# Patient Record
Sex: Female | Born: 1985 | Race: White | Hispanic: No | Marital: Single | Smoking: Current every day smoker
Health system: Southern US, Community
[De-identification: ages and names within clinical notes are randomized; demographics above are authoritative.]

## PROBLEM LIST (undated history)

## (undated) DIAGNOSIS — F32A Depression, unspecified: Secondary | ICD-10-CM

## (undated) DIAGNOSIS — F329 Major depressive disorder, single episode, unspecified: Secondary | ICD-10-CM

---

## 2007-12-07 ENCOUNTER — Emergency Department: Payer: Self-pay | Admitting: Emergency Medicine

## 2007-12-12 ENCOUNTER — Emergency Department: Payer: Self-pay | Admitting: Internal Medicine

## 2008-09-23 ENCOUNTER — Emergency Department: Payer: Self-pay | Admitting: Emergency Medicine

## 2009-07-04 ENCOUNTER — Encounter: Payer: Self-pay | Admitting: Maternal & Fetal Medicine

## 2009-07-18 ENCOUNTER — Encounter: Payer: Self-pay | Admitting: Maternal & Fetal Medicine

## 2009-07-31 ENCOUNTER — Encounter: Payer: Self-pay | Admitting: Maternal & Fetal Medicine

## 2009-08-25 ENCOUNTER — Observation Stay: Payer: Self-pay

## 2009-10-17 ENCOUNTER — Encounter: Payer: Self-pay | Admitting: Obstetrics & Gynecology

## 2010-05-26 ENCOUNTER — Ambulatory Visit: Payer: Self-pay | Admitting: Internal Medicine

## 2010-07-15 IMAGING — CR DG CHEST 2V
1 series · 2 of 2 positions shown · non-contrast
Comparison: none

REASON FOR EXAM: Cough
COMMENTS:

[Series 1: view not recorded · 0.17mm/px · 2 of 2 slices shown]
[im 1/2]
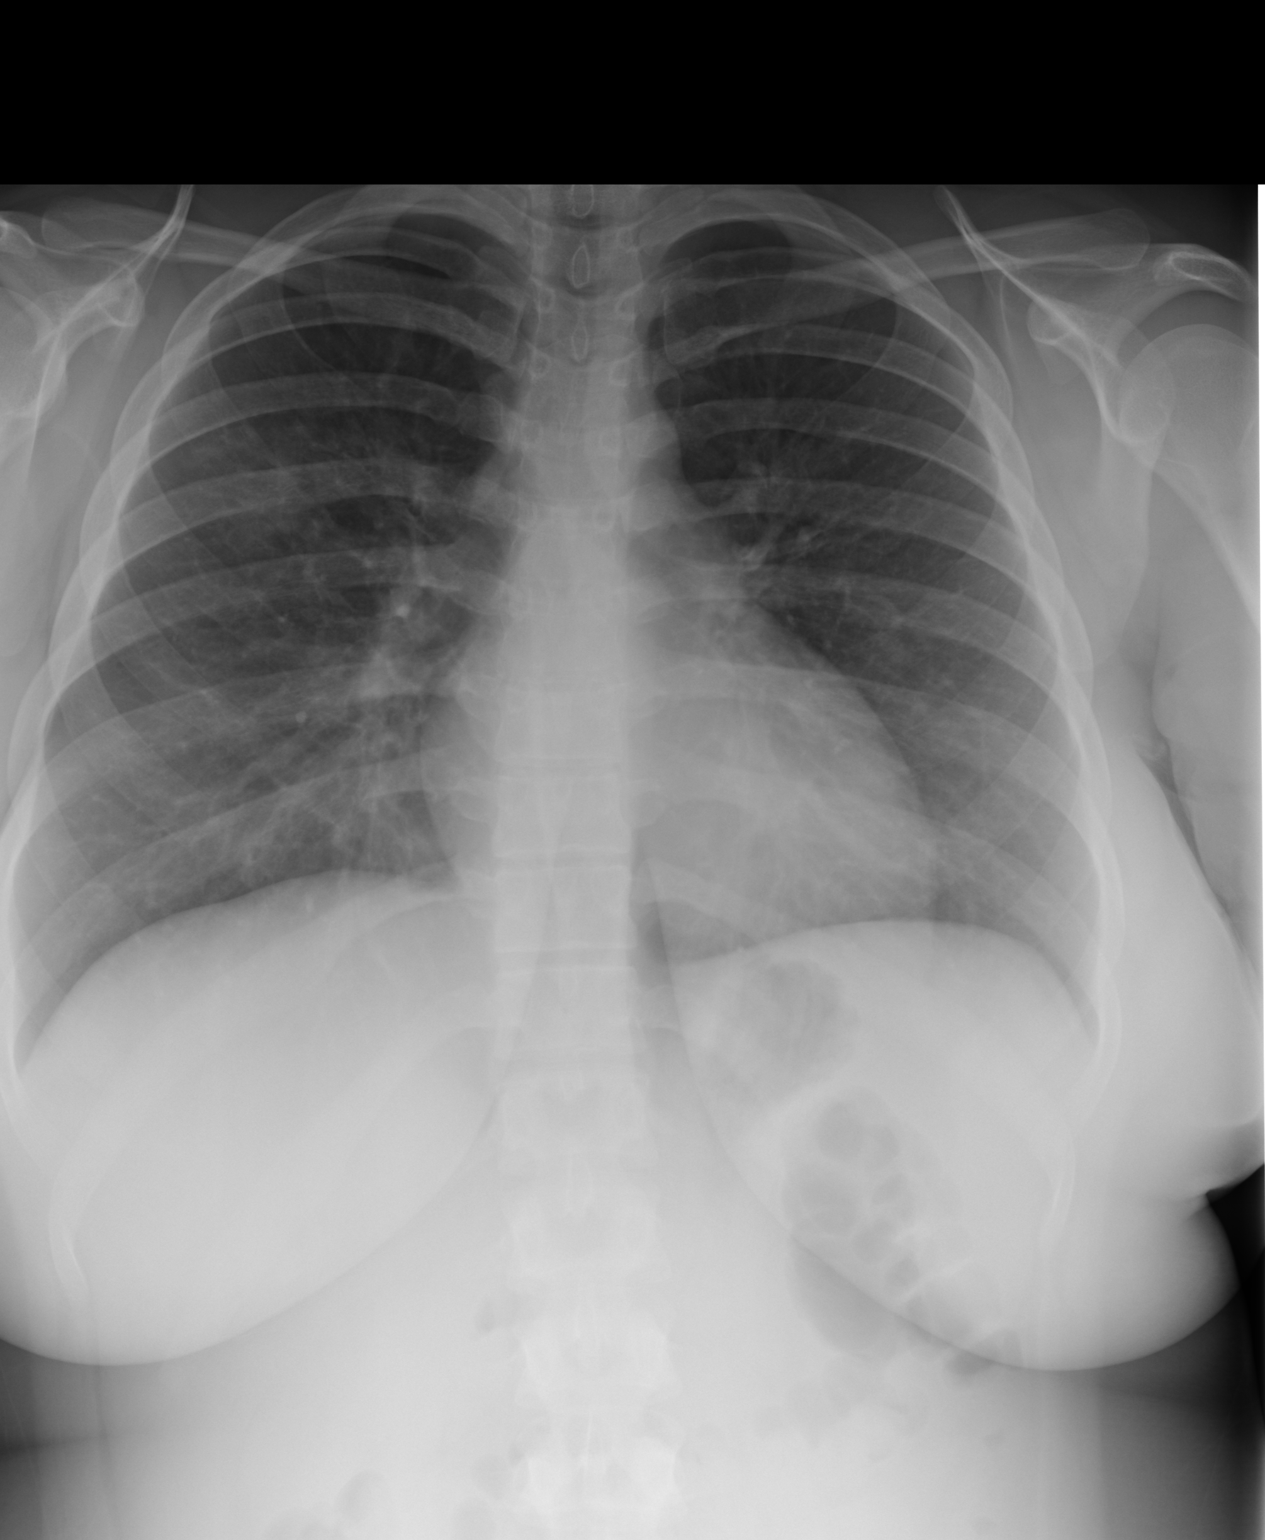
[im 2/2]
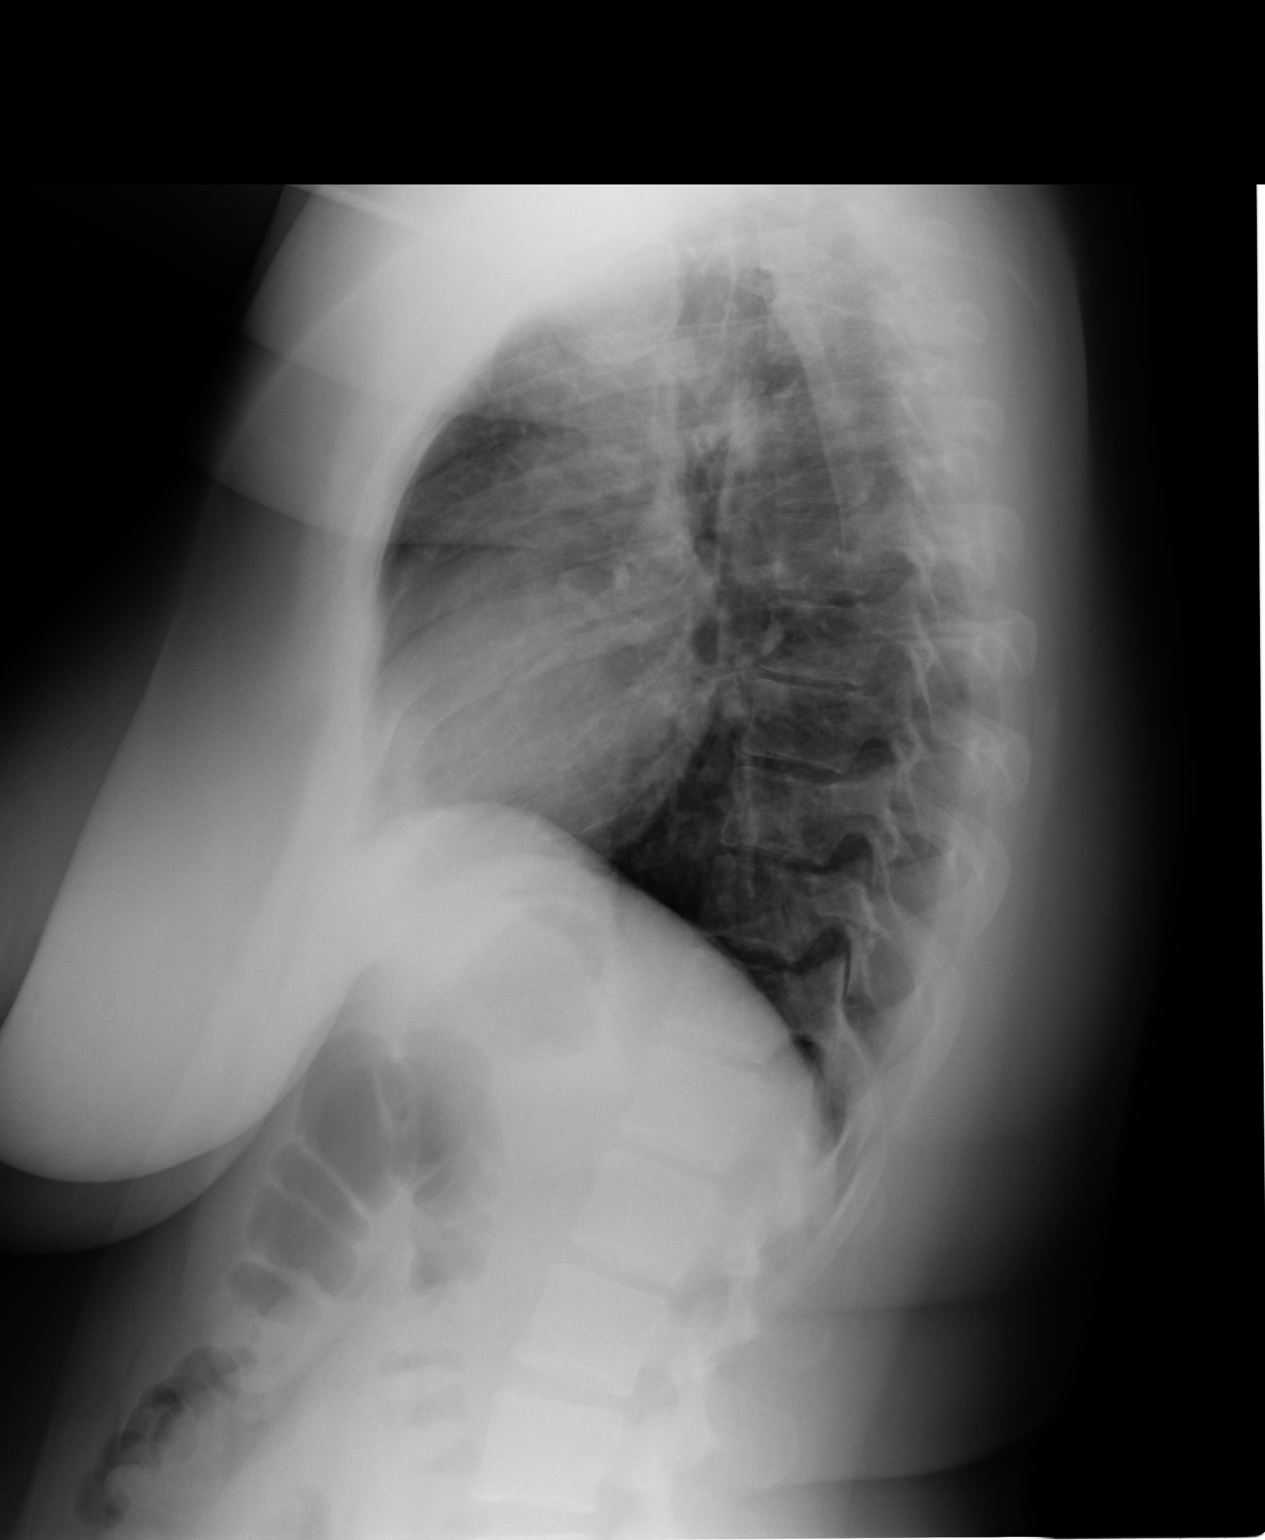

[2 of 2 positions shown; findings below may reference images not displayed]

PROCEDURE:     DXR - DXR CHEST PA (OR AP) AND LATERAL  - December 07, 2007  [DATE]

RESULT:     Motion artifact is present. The lungs appear to be clear except
for minimal density in the RIGHT mid lung to RIGHT upper lobe region. This
could represent early infiltrate.  Follow-up is recommended. There is no
effusion or pneumothorax.
IMPRESSION: Ill-defined patchy density in the RIGHT upper lobe
suggestive of early pneumonia. Follow-up is recommended.

## 2011-05-02 IMAGING — CR DG CHEST 2V
1 series · 2 of 2 positions shown · non-contrast
Comparison: none

REASON FOR EXAM: rhonchi breath sound
COMMENTS:

PROCEDURE:     DXR - DXR CHEST PA (OR AP) AND LATERAL  - September 23, 2008 [DATE]
RESULT:     The lungs are clear. The cardiovascular structures are
unremarkable.

[Series 1: view not recorded · 0.17mm/px · 2 of 2 slices shown]
[im 1/2]
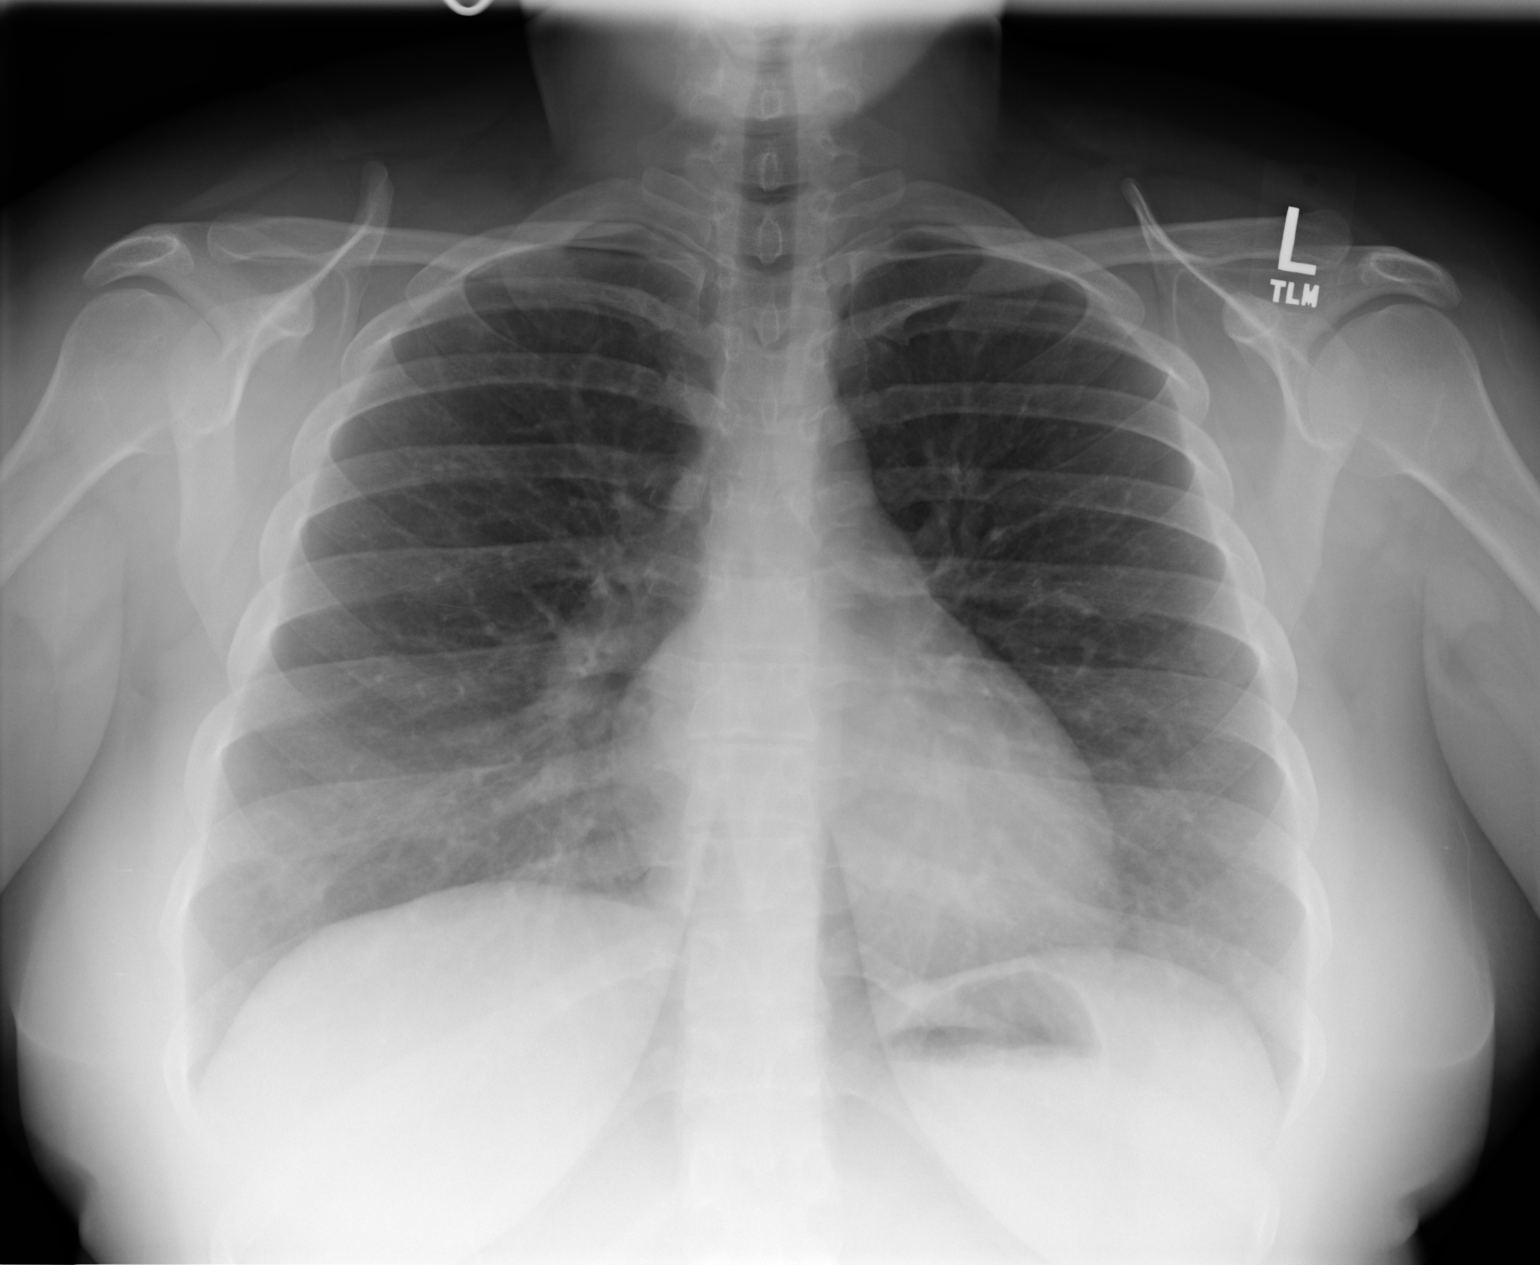
[im 2/2]
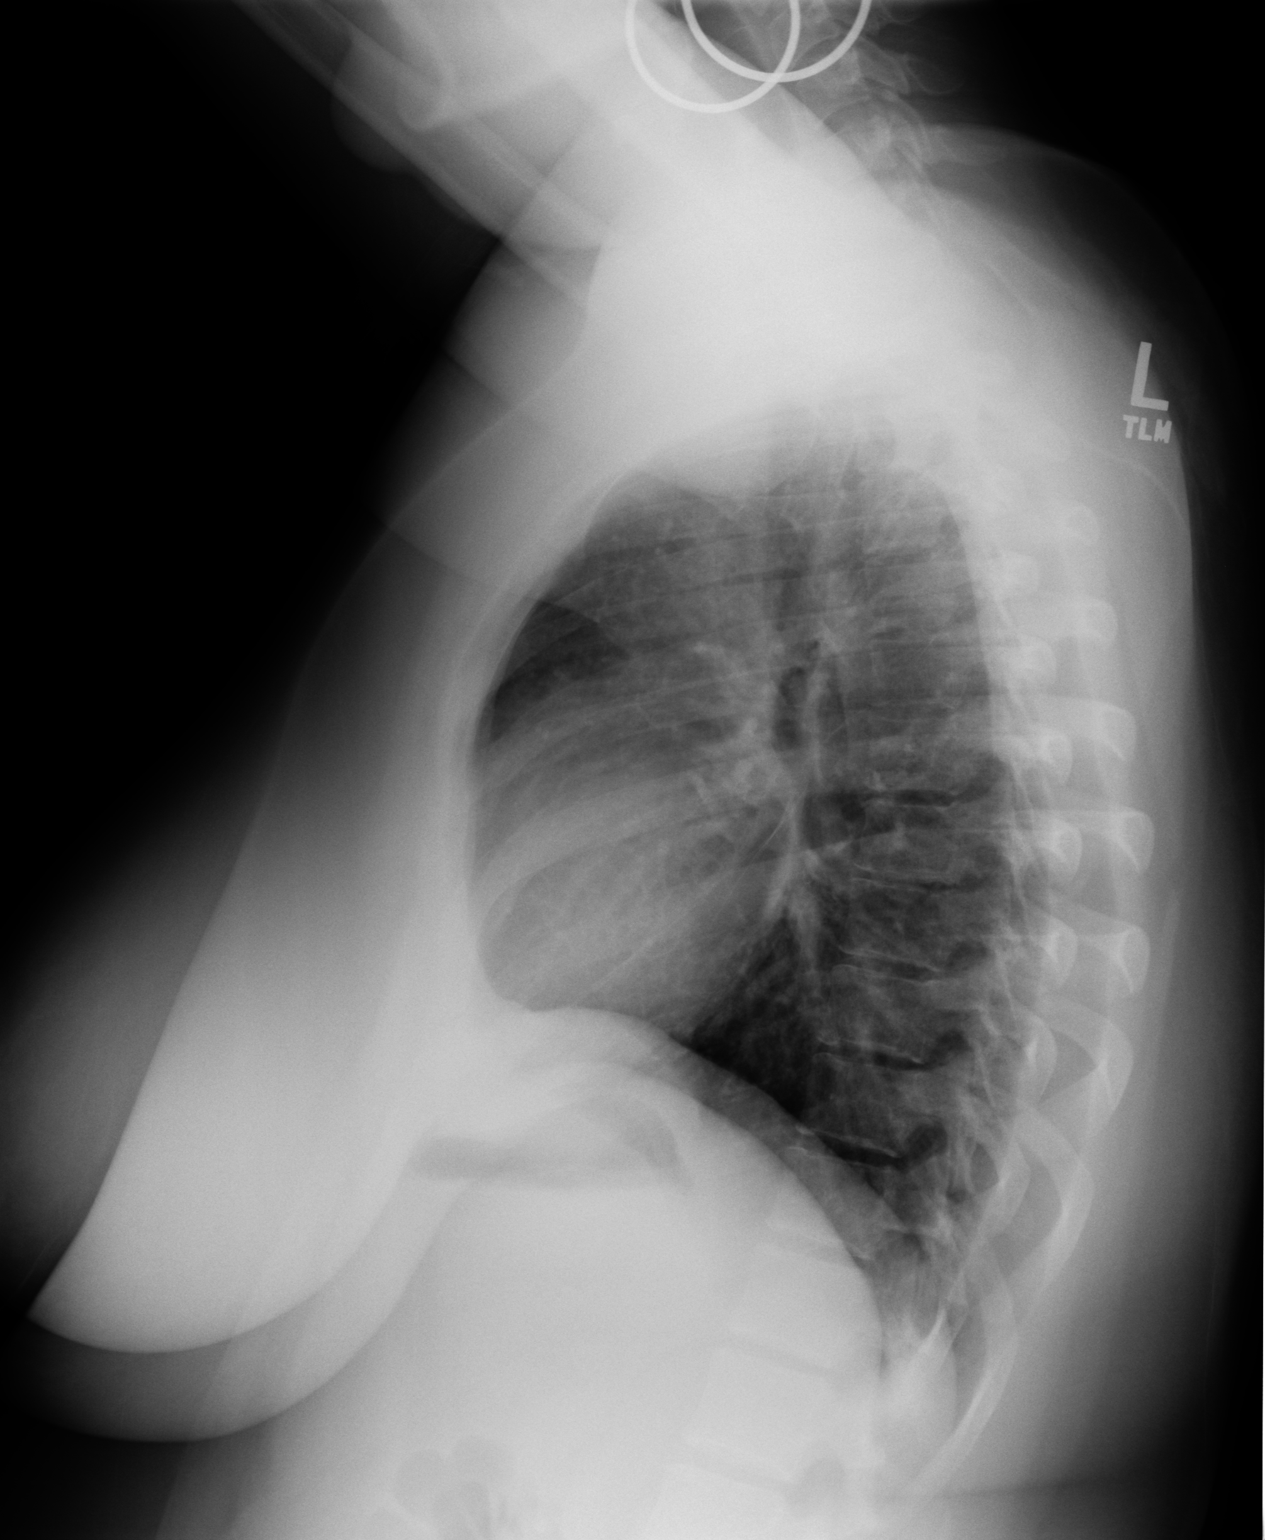

[2 of 2 positions shown; findings below may reference images not displayed]

IMPRESSION: 1. No acute cardiopulmonary disease.

## 2011-08-29 ENCOUNTER — Emergency Department: Payer: Self-pay | Admitting: Emergency Medicine

## 2011-08-29 LAB — URINALYSIS, COMPLETE
Bacteria: NONE SEEN
Bilirubin,UR: NEGATIVE
Glucose,UR: NEGATIVE mg/dL (ref 0–75)
Ketone: NEGATIVE
Leukocyte Esterase: NEGATIVE
Nitrite: NEGATIVE
Ph: 7 (ref 4.5–8.0)
Protein: NEGATIVE
RBC,UR: 1 /HPF (ref 0–5)
Specific Gravity: 1.011 (ref 1.003–1.030)
Squamous Epithelial: 3
WBC UR: <1 /HPF (ref 0–5)

## 2011-08-29 LAB — WET PREP, GENITAL

## 2011-08-29 LAB — PREGNANCY, URINE: Pregnancy Test, Urine: NEGATIVE m[IU]/mL

## 2012-02-10 IMAGING — US US OB DETAIL+14 WK - NRPT [facility]
1 series · 14 of 28 positions shown · non-contrast
Comparison: none

[Series 1: us ob detail+14 wk - nrpt [facility] · 14 of 115 slices shown]
[im 5/115]
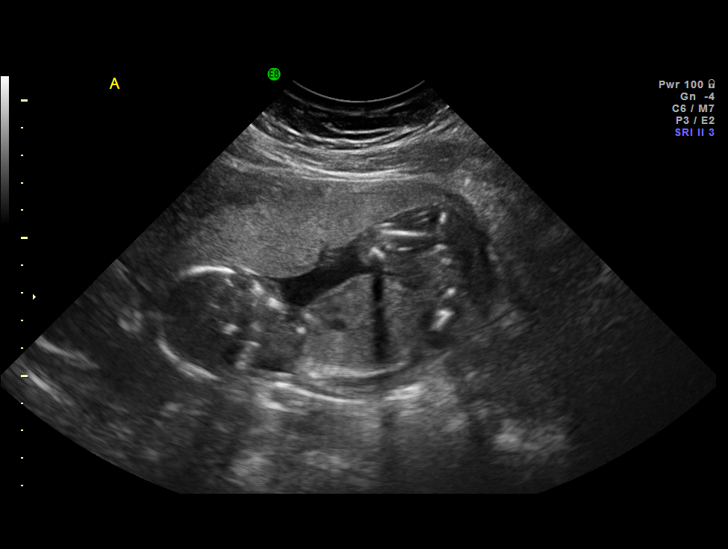
[im 13/115]
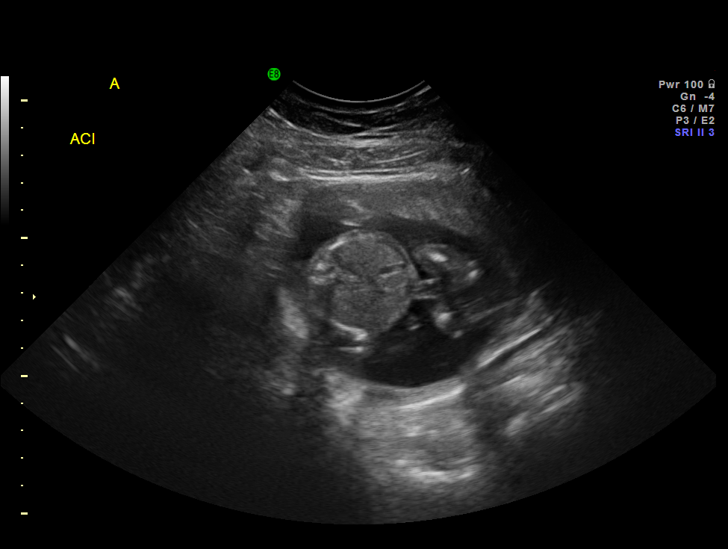
[im 22/115]
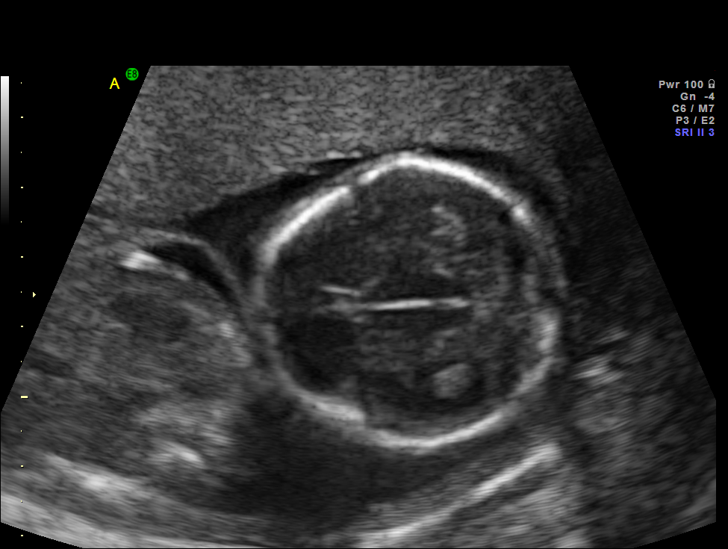
[im 30/115]
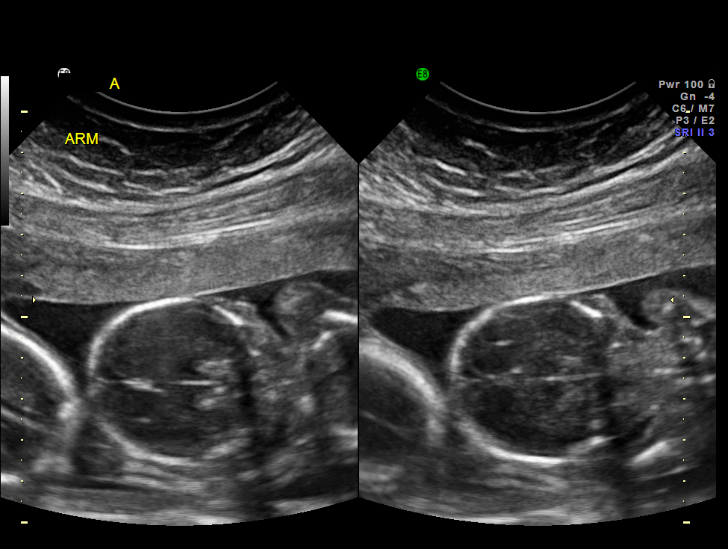
[im 39/115]
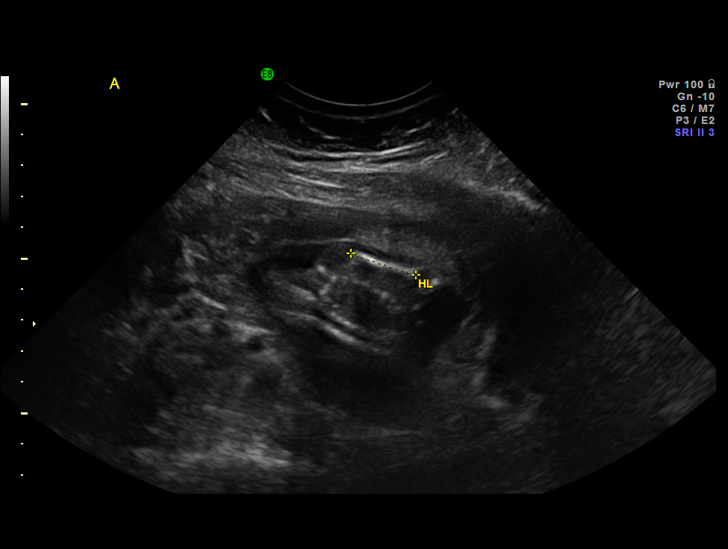
[im 47/115]
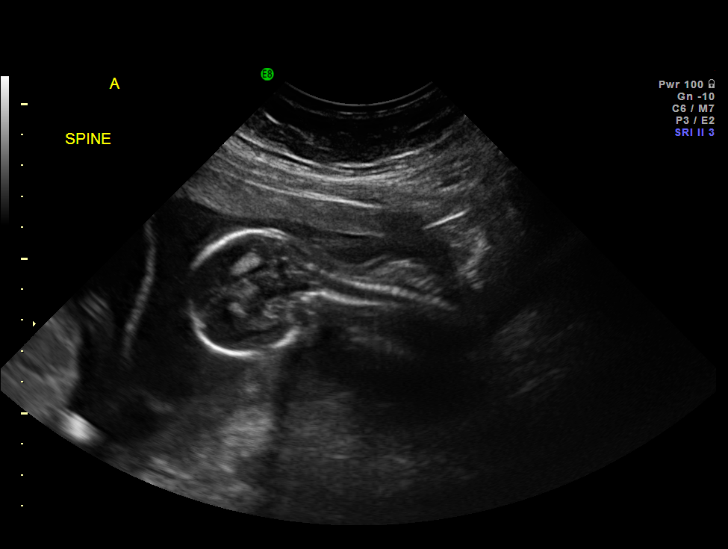
[im 55/115]
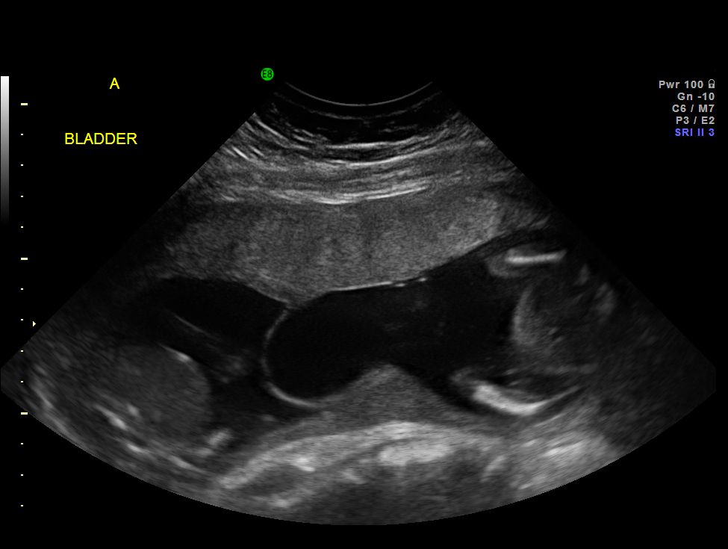
[im 64/115]
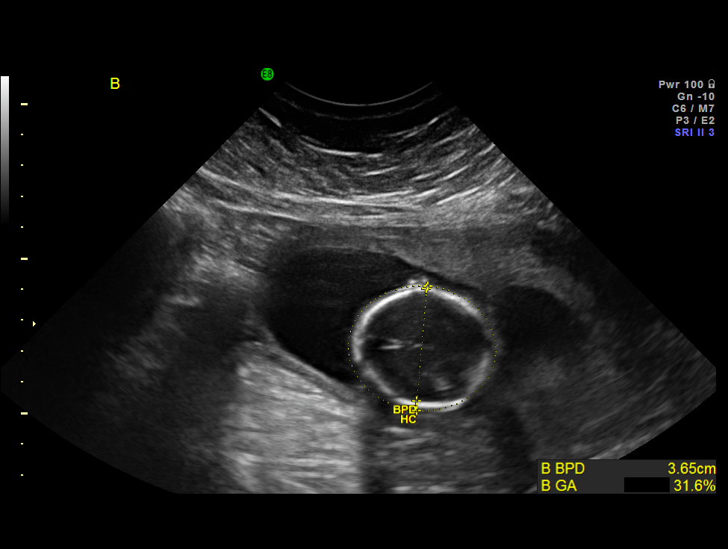
[im 72/115]
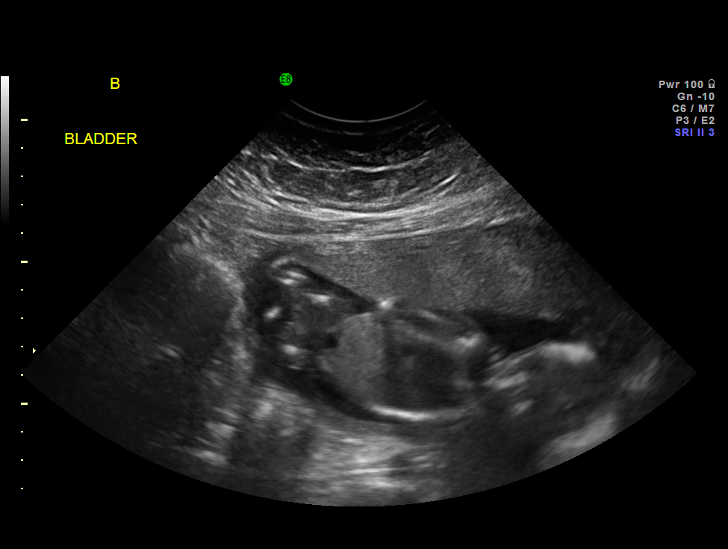
[im 81/115]
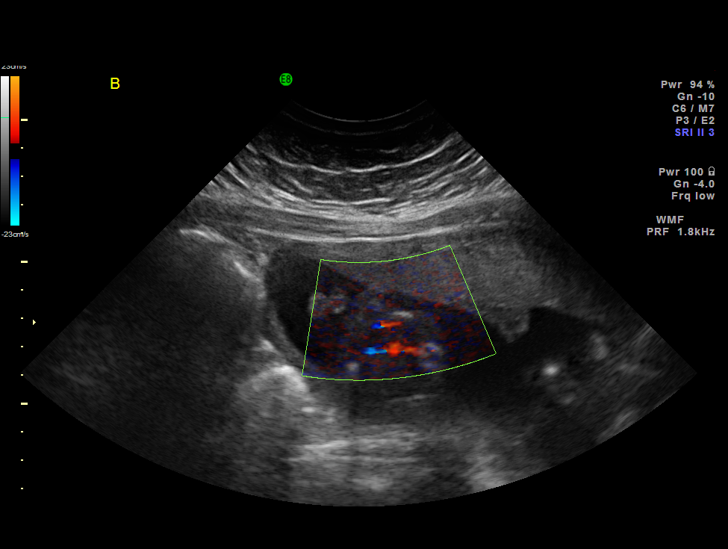
[im 89/115]
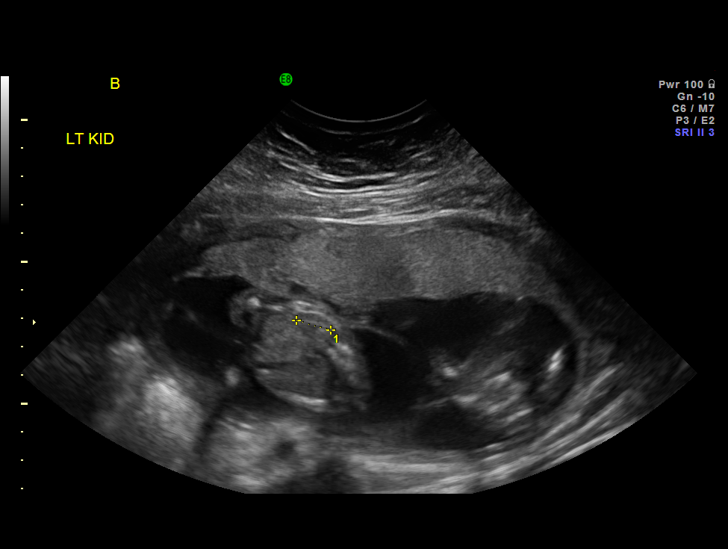
[im 98/115]
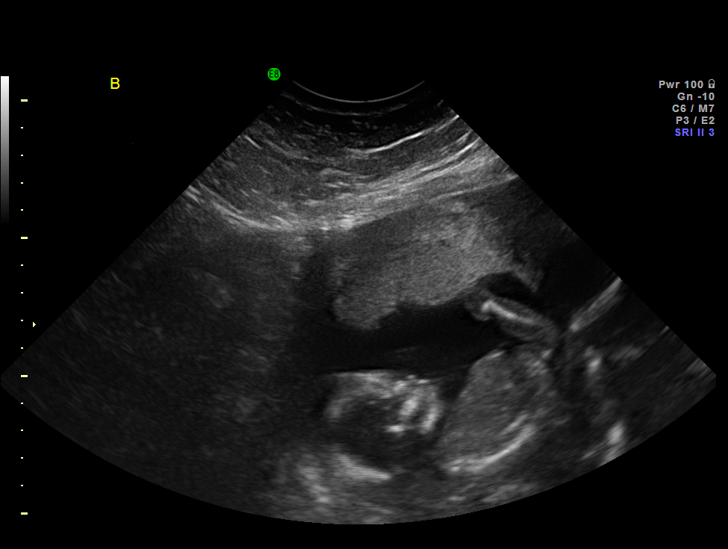
[im 106/115]
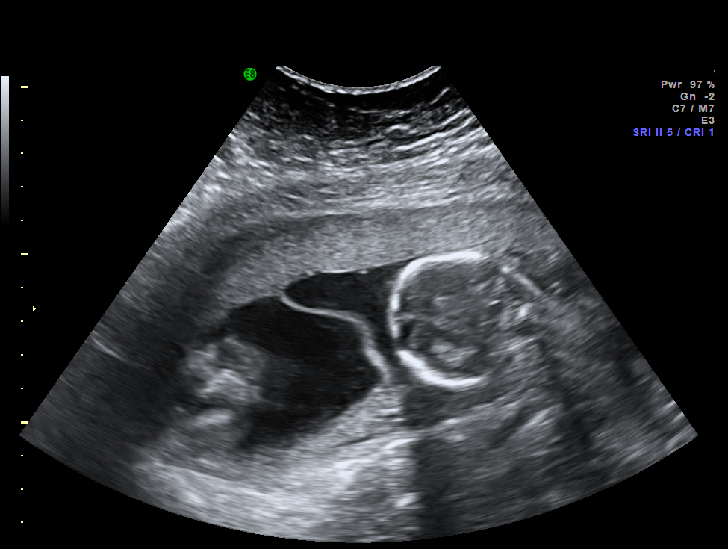
[im 115/115]
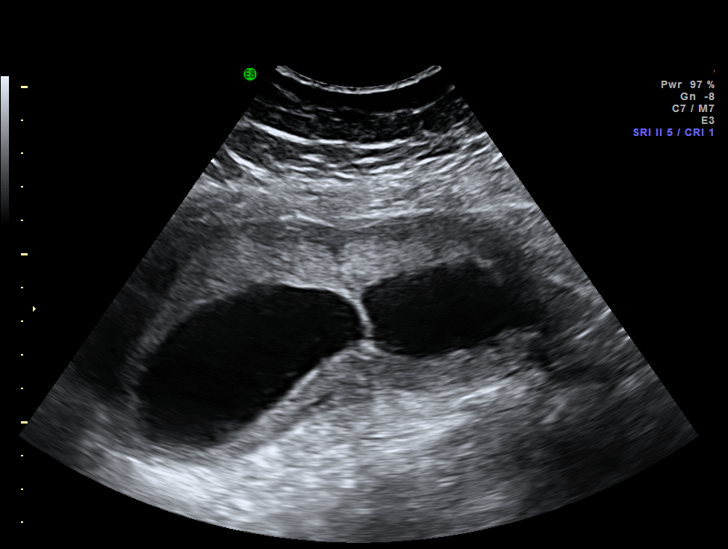

[14 of 28 positions shown; findings below may reference images not displayed]

IMAGES IMPORTED FROM THE [employer] WORKFLOW SYSTEM
NO DICTATION FOR STUDY

## 2013-11-21 ENCOUNTER — Observation Stay: Payer: Self-pay | Admitting: Obstetrics and Gynecology

## 2013-11-26 ENCOUNTER — Inpatient Hospital Stay: Payer: Self-pay

## 2013-11-26 LAB — CBC WITH DIFFERENTIAL/PLATELET
BASOS ABS: 0.1 10*3/uL (ref 0.0–0.1)
Basophil %: 0.3 %
EOS PCT: 0.5 %
Eosinophil #: 0.1 10*3/uL (ref 0.0–0.7)
HCT: 36.7 % (ref 35.0–47.0)
HGB: 11.4 g/dL — ABNORMAL LOW (ref 12.0–16.0)
LYMPHS ABS: 4 10*3/uL — AB (ref 1.0–3.6)
Lymphocyte %: 19.6 %
MCH: 24.7 pg — AB (ref 26.0–34.0)
MCHC: 31.1 g/dL — AB (ref 32.0–36.0)
MCV: 79 fL — ABNORMAL LOW (ref 80–100)
MONO ABS: 0.8 x10 3/mm (ref 0.2–0.9)
Monocyte %: 3.8 %
Neutrophil #: 15.3 10*3/uL — ABNORMAL HIGH (ref 1.4–6.5)
Neutrophil %: 75.8 %
PLATELETS: 339 10*3/uL (ref 150–440)
RBC: 4.63 10*6/uL (ref 3.80–5.20)
RDW: 14.7 % — ABNORMAL HIGH (ref 11.5–14.5)
WBC: 20.2 10*3/uL — ABNORMAL HIGH (ref 3.6–11.0)

## 2013-11-27 LAB — HEMATOCRIT: HCT: 30.5 % — ABNORMAL LOW (ref 35.0–47.0)

## 2013-12-01 LAB — PATHOLOGY REPORT

## 2014-07-24 NOTE — Op Note (Signed)
PATIENT NAMETAJAI, Roman MR#:  [id number] DATE OF BIRTH:  03/25/1986  DATE OF PROCEDURE:  11/26/2013  PREOPERATIVE DIAGNOSES:   1.  Intrauterine pregnancy at 39 weeks and 6 days.   2.  BMI 34. 3.  History of prior Cesarean section and desire for vaginal birth after Cesarean section.   4.  Failed trial of labor after Cesarean section due to arrest of dilation.   5.  Desire for permanent sterilization.  6.  Meconium-stained amniotic fluid.   POSTOPERATIVE DIAGNOSES:  1.  Intrauterine pregnancy at 39 weeks and 6 days.   2.  BMI 34. 3.  History of prior Cesarean section and desire for vaginal birth after Cesarean section.   4.  Failed trial of labor after Cesarean section due to arrest of dilation.   5.  Severe intraabdominal adhesive disease. 6.  Desire for permanent sterilization.  7.  Meconium-stained amniotic fluid.  8.  Pelvic adhesions  PROCEDURE:  Repeat low transverse Cesarean section via Pfannenstiel skin incision with double-layer uterine closure and bilateral tubal ligation via Parkland method on the left tube and modified Pomeroy method on the right. Placement of On-Q pump.    SURGEON: Aurora Bing, MD   ASSISTANTBonney Aid  ANESTHESIA:  Spinal.  INTERVENOUS FLUIDS: 800 mL crystalloid.   ESTIMATED BLOOD LOSS: 750 mL   URINE OUTPUT: 300 mL   ANTIBIOTICS:  Ancef 2 grams preoperatively x1.   SPECIMENS:  Portions of bilateral tubes to pathology.   COMPLICATIONS:  None.   DISPOSITION:  Stable to recovery room.   INDICATIONS:  The patient is a 29 year old G2, P1-0-0-2 at 39 weeks and 6 days, dated by 8-weeks ultrasound, who came into Labor and Delivery contracting regularly every 4 to 6 minutes and was dilated to 5 cm, after approximately 12 hours, 8 of which were status post AROM, the patient's cervix did not dilate and, given that she desired a repeat C-section.    FINDINGS:  Dense adhesions were noted above the level of the bladder flap from the anterior surface  of the uterus to the posterior aspect of the anterior abdominal, which did not allow for exteriorization of the uterus.  A female infant in cephalic presentation with Apgars of 6 and 9, birth weight 3470 grams, intact placenta with 3-vessel cord, grossly normal uterus, tubes and ovaries bilaterally.   DESCRIPTION OF PROCEDURE:  The patient was taken to the operating room where anesthesia was administered.  She was then prepped and draped in the normal sterile fashion in dorsal supine position with a leftward tilt.  A Pfannenstiel skin incision was made with the scalpel and carried through to the underlying fascia.  The fascia was then nicked in the midline with the scalpel and extended laterally with the Mayo scissors.  Attention was then turned to the superior and inferior aspect of the fascial incision, which was dissected off the rectus muscles with the Kocher clamps using sharp dissection.  Next, the rectus muscles were separated and then entered bluntly as well as the peritoneum and the bladder blade was then inserted and a low-transverse hysterotomy was made with the scalpel yielding meconium stained amniotic fluid.  The incision was extended bluntly and the infant's head was delivered atraumatically and the cord was clamped and cut x2 and handed to the awaiting pediatricians.  Next, the placenta was then manually expressed from the uterus and was cleared of all clots and debris.  The hysterotomy was then repaired with a running suture of 1-0  Vicryl and a second imbricating layer of 1-0 Vicryl was then placed for excellent hemostasis.    At this time, attention was then turned to the bilateral adnexa on the left side.  Using the Babcock clamp, a mid-portion of the fallopian tube was then grasped and an avascular plane found in the mesosalpinx and a window was created with the Bovie.  Next, using 3-0 Vicryl, Parkland method was then used for the tubal ligation.  Next, attention was then turned to the  right-sided fallopian tube.  Due to adhesions, a modified Pomeroy method was done using 2-0 plain gut.  The abdomen was then irrigated and excellent hemostasis was noted from the operative sites.  The fascia was then reapproximated with 1-0 PDS bilaterally after placement of the On-Q pump and the subcutaneous layer was then reapproximated with 3-0 Vicryl in an interrupted fashion.  The skin was then closed with 4-0 Vicryl.  The patient tolerated the procedure well.  Sponge, lap, needle and instrument counts were correct x2 and the patient was transferred to the recovery room, awake, alert and breathing independently in stable condition.     ____________________________ [name], MD cp:lr D: 11/30/2013 14:32:02 ET T: 11/30/2013 14:54:54 ET JOB#: [id number]  cc: [place] Bingharlie Ules Marsala, MD, <Dictator> [place] BingHARLIE Delon Revelo MD ELECTRONICALLY SIGNED 12/02/2013 8:34

## 2014-08-10 NOTE — H&P (Signed)
L&D Evaluation:  History:  HPI -CC: painful UCs -HPI: 29 y/o G2P1002 @ 39/6 (8wk u/s). Preg c/b prior c-sectin (2nd twin), BMI 39 with normal 1hr  No VB, LOF. +regular UCs and good FM   Medications Pre Natal Vitamins   Allergies amox, bactrim, codeine   Exam:  General moderate distress with UCs   Mental Status clear   Chest clear   Heart no murmur/gallop/rubs   Abdomen gravid, non-tender   Estimated Fetal Weight Average for gestational age, 693700gm   Fetal Position ceph   Pelvic 4cm per RN at 0900   Mebranes Intact   FHT 130 baseline, no accels, no decels, mod var   Ucx q1-3582m   Impression:  Impression patient doing well   Plan:  Comments *IUP: category I tracing *TOLAC: pt schedule for rpt c-section tomorrow but now in spontaneous labor, patient would like to TOLAC. r/b/a d/w pt, including <1% risk of rupture and she is amenable with moving forward. UCs well on her own and can arom if space out. consent signed for tolac and c section *GBS neg *analgesia: epidural now that cbc is back and normal *elevated BMI: no issues  A pos/RI/VI/rpr pending/hiv neg/hepB neg/tdap unknown/pap unknown/BTL?   Electronic Signatures: [place] BingPickens, Brilyn Tuller (MD)  (Signed 27-Aug-15 10:02)  Authored: L&D Evaluation   Last Updated: 27-Aug-15 10:02 by [place] BingPickens, Kenyen Candy (MD)

## 2014-08-10 NOTE — H&P (Signed)
L&D Evaluation:  History:  HPI 29 yo [facility]2P1002 [facility] presenting with contractions starting this evening.  States not particularly painfull but every 5 minutes.  No vaginal bleeding, no LOF, +FM.  No recent intercourse.  She has a hsitory of prior twin delivery A vaginal B C-section.  She would like to VBAC but is scheduled for C-section at 40 weeks if she fails to go into labor.  [facility]   Presents with contractions   Patient's Medical History No Chronic Illness   Patient's Surgical History Previous C-Section   Medications Pre Natal Vitamins   Allergies Codeine, percocet, bactrim, amoxicillin   Social History tobacco   Family History Non-Contributory   ROS:  ROS All systems were reviewed.  HEENT, CNS, GI, GU, Respiratory, CV, Renal and Musculoskeletal systems were found to be normal.   Exam:  Vital Signs stable   Urine Protein not completed   General no apparent distress   Mental Status clear   Chest clear   Heart normal sinus rhythm   Abdomen gravid, non-tender   Estimated Fetal Weight Average for gestational age   Back no CVAT   Edema no edema   Pelvic 1/Frerking/high unchanged over 2-hrs   Mebranes Intact   FHT normal rate with no decels, 140, moderate, positive accels, no decels   Ucx regular, q3-725min, patient sleeping at time of recheck   Skin dry, no lesions, no rashes   Impression:  Impression 29 yo G2P0102 at 39 weeks 1 day presenting for r/o labor   Plan:  Comments 1) R/O labor - cervix unchanged over 2-hrs, discharge home with routine labor precautions.  Discussed if really comitted to VBAC would be reasonable to allow till 41 weeks before considering C-section or induction.  Would also be amenable to induction at 40 weeks if favorable cervix.  At present patietn opt to stick with current plan of managment  2) Fetus - category I tracing/reactive NST/negative contraction stress test - early 1-hr 83,  29 week 166 (3-hr 76, 183, 137, 63) - pelvic tested to 4lbs - 18lbs weight gain this pregnancy - growth scan on 11/09/2013 at 6517w3d 3159g (6lbs 15oz) c/w 52.5%ile AFI 21.58cm  3) PNL A pos / ABSC neg / RI / VZI / HBsAg neg / HIV neg / RPR NR / 1-st trimester screen negative / MSAFP negative  4) TDAP received 10/05/2013  5) Contraception - BTL  6) Disposition - discharg home, follow up on 11/23/13, C-section scheduled for 11/27/13   Electronic Signatures: Lorrene ReidStaebler, Blanka Rockholt M (MD)  (Signed 22-Aug-15 05:47)  Authored: L&D Evaluation   Last Updated: 22-Aug-15 05:47 by Lorrene ReidStaebler, Makensie Mulhall M (MD)

## 2015-08-17 ENCOUNTER — Encounter: Payer: Self-pay | Admitting: Emergency Medicine

## 2015-08-17 ENCOUNTER — Emergency Department
Admission: EM | Admit: 2015-08-17 | Discharge: 2015-08-17 | Disposition: A | Discharge status: Home / Self Care | Payer: Medicaid Other | Attending: Student | Admitting: Student

## 2015-08-17 DIAGNOSIS — Y929 Unspecified place or not applicable: Secondary | ICD-10-CM | POA: Diagnosis not present

## 2015-08-17 DIAGNOSIS — F329 Major depressive disorder, single episode, unspecified: Secondary | ICD-10-CM | POA: Insufficient documentation

## 2015-08-17 DIAGNOSIS — S40862A Insect bite (nonvenomous) of left upper arm, initial encounter: Secondary | ICD-10-CM | POA: Diagnosis present

## 2015-08-17 DIAGNOSIS — Y9389 Activity, other specified: Secondary | ICD-10-CM | POA: Diagnosis not present

## 2015-08-17 DIAGNOSIS — F17210 Nicotine dependence, cigarettes, uncomplicated: Secondary | ICD-10-CM | POA: Insufficient documentation

## 2015-08-17 DIAGNOSIS — Y999 Unspecified external cause status: Secondary | ICD-10-CM | POA: Diagnosis not present

## 2015-08-17 DIAGNOSIS — W57XXXA Bitten or stung by nonvenomous insect and other nonvenomous arthropods, initial encounter: Secondary | ICD-10-CM | POA: Insufficient documentation

## 2015-08-17 DIAGNOSIS — L03114 Cellulitis of left upper limb: Secondary | ICD-10-CM

## 2015-08-17 HISTORY — DX: Depression, unspecified: F32.A

## 2015-08-17 HISTORY — DX: Major depressive disorder, single episode, unspecified: F32.9

## 2015-08-17 MED ORDER — CEPHALEXIN 500 MG PO CAPS: 500.0000 mg | ORAL_CAPSULE | Freq: Four times a day (QID) | ORAL | Status: DC

## 2015-08-17 MED ORDER — IBUPROFEN 800 MG PO TABS: 800.0000 mg | ORAL_TABLET | Freq: Three times a day (TID) | ORAL | Status: DC | PRN

## 2015-08-17 MED ORDER — TRAMADOL HCL 50 MG PO TABS
50.0000 mg | ORAL_TABLET | Freq: Four times a day (QID) | ORAL | Status: AC | PRN
Start: 2015-08-17 — End: 2016-08-16

## 2015-08-17 NOTE — ED Provider Notes (Signed)
[facility] Emergency Department Provider Note  ____________________________________________  Time seen: Approximately 7:28 PM  I have reviewed the triage vital signs and the nursing notes.   HISTORY  Chief Complaint Insect Bite and Arm Swelling    HPI Dawn Roman is a 30 y.o. female who presents for evaluation of being bit by some sort of insect last night on the right lumbar complaining of increased redness and swelling today. Denies any fever or chills. No shortness of breath or wheezing. Has not taken any medication over-the-counter for this.   Past Medical History  Diagnosis Date  . Depression     There are no active problems to display for this patient.   Past Surgical History  Procedure Laterality Date  . Cesarean section      Current Outpatient Rx  Name  Route  Sig  Dispense  Refill  . cephALEXin (KEFLEX) 500 MG capsule   Oral   Take 1 capsule (500 mg total) by mouth 4 (four) times daily.   40 capsule   0   . ibuprofen (ADVIL,MOTRIN) 800 MG tablet   Oral   Take 1 tablet (800 mg total) by mouth every 8 (eight) hours as needed.   30 tablet   0   . traMADol (ULTRAM) 50 MG tablet   Oral   Take 1 tablet (50 mg total) by mouth every 6 (six) hours as needed.   20 tablet   0     Allergies Bactrim and Percocet  No family history on file.  Social History Social History  Substance Use Topics  . Smoking status: Current Every Day Smoker -- 1.00 packs/day    Types: Cigarettes  . Smokeless tobacco: None  . Alcohol Use: No    Review of Systems Constitutional: No fever/chills Musculoskeletal: Negative for back pain. Skin: Positive for increased area of erythema. Neurological: Negative for headaches, focal weakness or numbness.  10-point ROS otherwise negative.  ____________________________________________   PHYSICAL EXAM:  VITAL SIGNS: ED Triage Vitals  Enc Vitals Group     BP 08/17/15 1907 142/75 mmHg     Pulse Rate  08/17/15 1907 72     Resp 08/17/15 1907 18     Temp 08/17/15 1907 97.7 F (36.5 C)     Temp Source 08/17/15 1907 Oral     SpO2 08/17/15 1907 99 %     Weight 08/17/15 1907 198 lb (89.812 kg)     Height 08/17/15 1907 4\' 11"  (1.499 m)     Head Cir --      Peak Flow --      Pain Score 08/17/15 1907 6     Pain Loc --      Pain Edu? --      Excl. in GC? --     Constitutional: Alert and oriented. Well appearing and in no acute distress.r.   Cardiovascular: Normal rate, regular rhythm. Grossly normal heart sounds.  Good peripheral circulation. Respiratory: Normal respiratory effort.  No retractions. Lungs CTAB. Neurologic:  Normal speech and language. No gross focal neurologic deficits are appreciated. No gait instability. Skin:  Skin is warm, dry and intact. Positive area of cellulitis extending about 10 x 10 cm around the left upper arm. Psychiatric: Mood and affect are normal. Speech and behavior are normal.  ____________________________________________   LABS (all labs ordered are listed, but only abnormal results are displayed)  Labs Reviewed - No data to display ____________________________________________    PROCEDURES  Procedure(s) performed: None  Critical Care  performed: No  ____________________________________________   INITIAL IMPRESSION / ASSESSMENT AND PLAN / ED COURSE  Pertinent labs & imaging results that were available during my care of the patient were reviewed by me and considered in my medical decision making (see chart for details).  Acute cellulitis secondary to insect bite. Area was marked with a marker and highlighted patient started on Keflex 500 mg 4 times a day, Motrin and tramadol as needed for pain or discomfort. Patient follow-up with PCP or return to the ER with any worsening symptomology. Patient voices no other emergency medical complaints at this time. ____________________________________________   FINAL CLINICAL IMPRESSION(S) / ED  DIAGNOSES  Final diagnoses:  Insect bite  Cellulitis of left upper extremity     This chart was dictated using voice recognition software/Dragon. Despite best efforts to proofread, errors can occur which can change the meaning. Any change was purely unintentional.   Evangeline Dakin, PA-C 08/17/15 1934  Gayla Doss, MD 08/17/15 [phone]

## 2015-08-17 NOTE — ED Notes (Signed)
AAOx3.  Skin warm and dry.  NAD 

## 2015-08-17 NOTE — ED Notes (Signed)
Pt presents to ED with left upper arm swelling. Pt states she was bitten by an insect yesterday while cutting the grass. Pt states she does not know what bit her other than it felt fuzzy when she smacked it off her arm. Pt reports left upper arm pain. Pt states she took a benadryl around 2 pm today without any relief.

## 2015-08-17 NOTE — Discharge Instructions (Signed)

## 2015-08-17 NOTE — ED Notes (Signed)
Virl Diamondhuck, [place]PA at bedside.

## 2015-08-17 NOTE — ED Notes (Signed)
Pt presents to ED with c/o redness and swelling noted to left upper arm, pt reports believes she was bitten stung by something yesterday while mowing her lawn, reports does not know what bit her. Left arm warm to touch. Pt reports swelling, redness and pain has increased since yesterday. Reports took Benadryl today at 2pm. NAD noted, will continue to monitor.

## 2018-05-01 ENCOUNTER — Other Ambulatory Visit: Payer: Self-pay

## 2018-05-01 ENCOUNTER — Emergency Department
Admission: EM | Admit: 2018-05-01 | Discharge: 2018-05-01 | Disposition: A | Discharge status: Home / Self Care | Payer: No Typology Code available for payment source | Attending: Emergency Medicine | Admitting: Emergency Medicine

## 2018-05-01 DIAGNOSIS — F17210 Nicotine dependence, cigarettes, uncomplicated: Secondary | ICD-10-CM | POA: Insufficient documentation

## 2018-05-01 DIAGNOSIS — M549 Dorsalgia, unspecified: Secondary | ICD-10-CM | POA: Insufficient documentation

## 2018-05-01 DIAGNOSIS — M542 Cervicalgia: Secondary | ICD-10-CM | POA: Insufficient documentation

## 2018-05-01 DIAGNOSIS — F329 Major depressive disorder, single episode, unspecified: Secondary | ICD-10-CM | POA: Diagnosis not present

## 2018-05-01 MED ORDER — LIDOCAINE 5 % EX PTCH: 1.0000 | MEDICATED_PATCH | CUTANEOUS | 0 refills | Status: DC

## 2018-05-01 MED ORDER — CYCLOBENZAPRINE HCL 5 MG PO TABS: ORAL_TABLET | ORAL | 0 refills | Status: DC

## 2018-05-01 MED ORDER — IBUPROFEN 600 MG PO TABS: 600.0000 mg | ORAL_TABLET | Freq: Four times a day (QID) | ORAL | 0 refills | Status: DC | PRN
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
## 2018-12-22 ENCOUNTER — Emergency Department
Admission: EM | Admit: 2018-12-22 | Discharge: 2018-12-22 | Disposition: A | Discharge status: Home / Self Care | Payer: Managed Care, Other (non HMO) | Attending: Emergency Medicine | Admitting: Emergency Medicine

## 2018-12-22 ENCOUNTER — Other Ambulatory Visit: Payer: Self-pay

## 2018-12-22 DIAGNOSIS — F17210 Nicotine dependence, cigarettes, uncomplicated: Secondary | ICD-10-CM | POA: Insufficient documentation

## 2018-12-22 DIAGNOSIS — R002 Palpitations: Secondary | ICD-10-CM | POA: Insufficient documentation

## 2018-12-22 DIAGNOSIS — Z79899 Other long term (current) drug therapy: Secondary | ICD-10-CM | POA: Diagnosis not present

## 2018-12-22 LAB — CBC
HCT: 38.8 % (ref 36.0–46.0)
Hemoglobin: 11.8 g/dL — ABNORMAL LOW (ref 12.0–15.0)
MCH: 22.2 pg — ABNORMAL LOW (ref 26.0–34.0)
MCHC: 30.4 g/dL (ref 30.0–36.0)
MCV: 73.1 fL — ABNORMAL LOW (ref 80.0–100.0)
Platelets: 347 10*3/uL (ref 150–400)
RBC: 5.31 MIL/uL — ABNORMAL HIGH (ref 3.87–5.11)
RDW: 16.5 % — ABNORMAL HIGH (ref 11.5–15.5)
WBC: 11.1 10*3/uL — ABNORMAL HIGH (ref 4.0–10.5)
nRBC: 0 % (ref 0.0–0.2)

## 2018-12-22 LAB — COMPREHENSIVE METABOLIC PANEL
ALT: 14 U/L (ref 0–44)
AST: 14 U/L — ABNORMAL LOW (ref 15–41)
Albumin: 4.2 g/dL (ref 3.5–5.0)
Alkaline Phosphatase: 75 U/L (ref 38–126)
Anion gap: 9 (ref 5–15)
BUN: 11 mg/dL (ref 6–20)
CO2: 25 mmol/L (ref 22–32)
Calcium: 9.8 mg/dL (ref 8.9–10.3)
Chloride: 105 mmol/L (ref 98–111)
Creatinine, Ser: 0.74 mg/dL (ref 0.44–1.00)
GFR calc Af Amer: >60 mL/min (ref >60)
GFR calc non Af Amer: >60 mL/min (ref >60)
Glucose, Bld: 103 mg/dL — ABNORMAL HIGH (ref 70–99)
Potassium: 4.3 mmol/L (ref 3.5–5.1)
Sodium: 139 mmol/L (ref 135–145)
Total Bilirubin: 0.5 mg/dL (ref 0.3–1.2)
Total Protein: 7.5 g/dL (ref 6.5–8.1)

## 2018-12-22 LAB — TROPONIN I (HIGH SENSITIVITY): Troponin I (High Sensitivity): <2 ng/L (ref <18)

## 2018-12-22 NOTE — ED Triage Notes (Signed)
Pt in with co midsternal chest pain and left upper arm pain since today, states hard to describe pain. No shob, or recent illness In the past, denies any cardiac history.

## 2018-12-22 NOTE — ED Provider Notes (Signed)
[facility] Emergency Department Provider Note  Time seen: 8:33 PM  I have reviewed the triage vital signs and the nursing notes.   HISTORY  Chief Complaint Chest Pain   HPI Dawn Roman is a 33 y.o. female with a past medical history of depression presents to the emergency department for chest discomfort/fluttering.  According to the patient several times today she states she felt a fluttering sensation like something swinging across the front of her chest.  Denies any "chest pain" denies any shortness of breath.  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  No leg pain or swelling.  Patient denies any symptoms at all currently.   Past Medical History:  Diagnosis Date  . Depression     There are no active problems to display for this patient.   Past Surgical History:  Procedure Laterality Date  . CESAREAN SECTION      Prior to Admission medications   Medication Sig Start Date End Date Taking? Authorizing Provider  cephALEXin (KEFLEX) 500 MG capsule Take 1 capsule (500 mg total) by mouth 4 (four) times daily. 08/17/15   Beers, Charmayne Sheerharles M, PA-C  cyclobenzaprine (FLEXERIL) 5 MG tablet Take 1-2 tablets 3 times daily as needed 05/01/18   Enid DerryWagner, Ashley, PA-C  ibuprofen (ADVIL,MOTRIN) 600 MG tablet Take 1 tablet (600 mg total) by mouth every 6 (six) hours as needed. 05/01/18   Enid DerryWagner, Ashley, PA-C  lidocaine (LIDODERM) 5 % Place 1 patch onto the skin daily. Remove & Discard patch within 12 hours or as directed by MD 05/01/18   Enid DerryWagner, Ashley, PA-C    Allergies  Allergen Reactions  . Bactrim [Sulfamethoxazole-Trimethoprim] Other (See Comments)    Yeast infection  . Percocet [Oxycodone-Acetaminophen] Itching    No family history on file.  Social History Social History   Tobacco Use  . Smoking status: Current Every Day Smoker    Packs/day: 1.00    Types: Cigarettes  Substance Use Topics  . Alcohol  use: No  . Drug use: No    Review of Systems Constitutional: Negative for fever. Cardiovascular: Negative for chest pain.  Positive for fluttering/palpitations. Respiratory: Negative for shortness of breath. Gastrointestinal: Negative for abdominal pain Musculoskeletal: Negative for leg pain or swelling. Neurological: Negative for headache All other ROS negative  ____________________________________________   PHYSICAL EXAM:  VITAL SIGNS: ED Triage Vitals  Enc Vitals Group     BP 12/22/18 1905 (!) 150/76     Pulse Rate 12/22/18 1905 86     Resp 12/22/18 1905 20     Temp 12/22/18 1905 98.3 F (36.8 C)     Temp Source 12/22/18 1905 Oral     SpO2 12/22/18 1905 100 %     Weight 12/22/18 1906 204 lb (92.5 kg)     Height 12/22/18 1906 4\' 11"  (1.499 m)     Head Circumference --      Peak Flow --      Pain Score 12/22/18 1905 0     Pain Loc --      Pain Edu? --      Excl. in GC? --     Constitutional: Alert and oriented. Well appearing and in no distress. Eyes: Normal exam ENT      Head: Normocephalic and atraumatic.      Mouth/Throat: Mucous membranes are moist. Cardiovascular: Normal rate, regular rhythm Respiratory: Normal respiratory effort without tachypnea nor retractions. Breath sounds are clear Gastrointestinal: Soft  and nontender. No distention.  Musculoskeletal: Nontender with normal range of motion in all extremities Neurologic:  Normal speech and language. No gross focal neurologic deficits  Skin:  Skin is warm, dry and intact.  Psychiatric: Mood and affect are normal.   ____________________________________________    EKG  EKG viewed and interpreted by myself shows a normal sinus rhythm 89 bpm with a narrow QRS, normal axis, normal intervals, no concerning ST changes.  ____________________________________________   INITIAL IMPRESSION / ASSESSMENT AND PLAN / ED COURSE  Pertinent labs & imaging results that were available during my care of the patient  were reviewed by me and considered in my medical decision making (see chart for details).   Patient presents to the emergency department for intermittent palpitations/fluttering sensation she has been feeling today.  Denies any symptoms currently.  Denies any chest pain at any point.  Denies any shortness of breath cough.  Differential would include palpitations, arrhythmia, PVCs, electrolyte or metabolic abnormality, less likely ACS.  We will check labs including cardiac enzymes.  Patient's EKG is reassuring.  As [place] as the patient's work-up is normal anticipate likely discharge home with cardiology follow-up for a Holter monitor.  Patient agreeable to plan of care.  Patient's labs including troponin have resulted largely within normal limits.  Given a negative troponin and believe the patient safe for discharge home.  We will recommend follow-up with cardiology for further evaluation.  Patient agreeable to plan of care.  Dawn Roman was evaluated in Emergency Department on 12/22/2018 for the symptoms described in the history of present illness. She was evaluated in the context of the global COVID-19 pandemic, which necessitated consideration that the patient might be at risk for infection with the SARS-CoV-2 virus that causes COVID-19. Institutional protocols and algorithms that pertain to the evaluation of patients at risk for COVID-19 are in a state of rapid change based on information released by regulatory bodies including the CDC and federal and state organizations. These policies and algorithms were followed during the patient's care in the ED.  ____________________________________________   FINAL CLINICAL IMPRESSION(S) / ED DIAGNOSES  Palpitations   Harvest Dark, MD 12/22/18 2046

## 2018-12-22 NOTE — ED Notes (Signed)
PT denied to stay for repeat vitals, stating she has to get home to her kids.

## 2018-12-22 NOTE — ED Notes (Signed)
Upon assessment of pt, pt appears in no distress. Avidly denies "pain". States it feels like fluttering on top of her chest, not in her chest. Pt able to ambulate with no difficulty. States hx of anemia. Pt keeps using word "weird". States it comes and goes.

## 2022-07-17 ENCOUNTER — Telehealth: Payer: Self-pay

## 2022-07-17 ENCOUNTER — Encounter: Payer: Self-pay | Admitting: Nurse Practitioner

## 2022-07-17 NOTE — Telephone Encounter (Signed)
Patient Dawn Roman asking for call back 

## 2022-07-18 ENCOUNTER — Other Ambulatory Visit: Payer: Self-pay | Admitting: Nurse Practitioner

## 2022-07-18 MED ORDER — MEDROXYPROGESTERONE ACETATE 10 MG PO TABS: 10.0000 mg | ORAL_TABLET | Freq: Every day | ORAL | 0 refills | Status: DC

## 2022-08-08 ENCOUNTER — Other Ambulatory Visit: Payer: Self-pay | Admitting: Nurse Practitioner

## 2022-08-08 DIAGNOSIS — N939 Abnormal uterine and vaginal bleeding, unspecified: Secondary | ICD-10-CM

## 2022-09-03 ENCOUNTER — Ambulatory Visit (INDEPENDENT_AMBULATORY_CARE_PROVIDER_SITE_OTHER): Payer: Self-pay | Admitting: Obstetrics

## 2022-09-03 ENCOUNTER — Other Ambulatory Visit (HOSPITAL_COMMUNITY)
Admission: RE | Admit: 2022-09-03 | Discharge: 2022-09-03 | Disposition: A | Discharge status: Home / Self Care | Payer: Self-pay | Source: Ambulatory Visit | Attending: Obstetrics | Admitting: Obstetrics

## 2022-09-03 ENCOUNTER — Encounter: Payer: Self-pay | Admitting: Obstetrics

## 2022-09-03 VITALS — BP 128/69 | HR 90 | Ht 59.0 in | Wt 190.0 lb

## 2022-09-03 DIAGNOSIS — Z01419 Encounter for gynecological examination (general) (routine) without abnormal findings: Secondary | ICD-10-CM

## 2022-09-03 DIAGNOSIS — N926 Irregular menstruation, unspecified: Secondary | ICD-10-CM

## 2022-09-03 DIAGNOSIS — Z124 Encounter for screening for malignant neoplasm of cervix: Secondary | ICD-10-CM

## 2022-09-03 DIAGNOSIS — N939 Abnormal uterine and vaginal bleeding, unspecified: Secondary | ICD-10-CM

## 2022-09-03 MED ORDER — NORETHINDRONE 0.35 MG PO TABS: 1.0000 | ORAL_TABLET | Freq: Every day | ORAL | 11 refills | Status: DC

## 2022-09-03 NOTE — Progress Notes (Signed)
Gynecology Annual Exam   PCP: Orson Eva, NP  Chief Complaint:  Chief Complaint  Patient presents with   Menstrual Problem    No abnormal pain x 8 months    History of Present Illness: Patient is a 37 y.o. [id number] presents for annual exam. The patient has no complaints today. Originately she was scheduled for a problem visit and to discuss irregular cycles, but she has not had a GYN physical in may years and is overdue for a pap smear. She consents to a Well Woman Gyn exam today with a pap. And we will discuss her AUB as an additional problem. She is married,and sexually active. Hx of one CS and one SVD She is a smoker and not interested in quitting.  LMP: Patient's last menstrual period was 07/31/2022 (exact date). Average Interval: irregular,  a varying number of days.she tracks her periods and bleeding pattern on an App.   Duration of flow:  again, this varies   Heavy Menses: occasionally Clots: no Intermenstrual Bleeding: yes Postcoital Bleeding: no Dysmenorrhea: no  The patient is sexually active. She currently uses tubal ligation for contraception. She denies dyspareunia.  The patient does not perform self breast exams.  There is no notable family history of breast or ovarian cancer in her family.  The patient wears seatbelts: yes.   The patient has regular exercise: no.    The patient denies current symptoms of depression.    Review of Systems: Review of Systems  Constitutional: Negative.   HENT: Negative.    Eyes: Negative.   Respiratory: Negative.    Cardiovascular: Negative.   Gastrointestinal: Negative.   Genitourinary: Negative.   Musculoskeletal: Negative.   Skin: Negative.   Neurological: Negative.   Endo/Heme/Allergies: Negative.   Psychiatric/Behavioral: Negative.      Past Medical History:  There are no problems to display for this patient.   Past Surgical History:  Past Surgical History:  Procedure Laterality Date   CESAREAN SECTION       Gynecologic History:  Patient's last menstrual period was 07/31/2022 (exact date). Contraception: tubal ligation Last Pap: Results were:  results unavailable. She reports normal results. One abnormal as a teenager.    Obstetric History: [id number]  Family History:  History reviewed. No pertinent family history.  Social History:  Social History   Socioeconomic History   Marital status: Single    Spouse name: Not on file   Number of children: Not on file   Years of education: Not on file   Highest education level: Not on file  Occupational History   Not on file  Tobacco Use   Smoking status: Every Day    Packs/day: 1    Types: Cigarettes   Smokeless tobacco: Not on file  Vaping Use   Vaping Use: Former  Substance and Sexual Activity   Alcohol use: Yes    Comment: occ   Drug use: No   Sexual activity: Yes    Birth control/protection: None  Other Topics Concern   Not on file  Social History Narrative   Not on file   Social Determinants of Health   Financial Resource Strain: Not on file  Food Insecurity: Not on file  Transportation Needs: Not on file  Physical Activity: Not on file  Stress: Not on file  Social Connections: Not on file  Intimate Partner Violence: Not on file    Allergies:  Allergies  Allergen Reactions   Bactrim [Sulfamethoxazole-Trimethoprim] Other (See Comments)  Yeast infection   Codeine Other (See Comments)   Percocet [Oxycodone-Acetaminophen] Itching    Medications: Prior to Admission medications   Medication Sig Start Date End Date Taking? Authorizing Provider  Ferrous Sulfate (IRON PO) Take 2 tablets by mouth daily. Mega[employer] Blood Builder   Yes [provider]  Multiple Vitamin (MULTIVITAMIN PO) Take by mouth.   Yes [provider]    Physical Exam Vitals: Blood pressure 128/69, pulse 90, height 4\' 11"  (1.499 m), weight 190 lb (86.2 kg), last menstrual period 07/31/2022.  General: NAD, BMI 38 HEENT:  normocephalic, anicteric Thyroid: no enlargement, no palpable nodules Pulmonary: No increased work of breathing, CTAB Cardiovascular: RRR,  Breast: Breast symmetrical, E or F cup size, pendulous,no tenderness, no palpable nodules or masses, no skin or nipple retraction present, no nipple discharge.  No axillary or supraclavicular lymphadenopathy. Abdomen: NABS, soft, non-tender, non-distended.  Umbilicus without lesions.  No hepatomegaly, splenomegaly or masses palpable. No evidence of hernia  Genitourinary:  External: Normal external female genitalia.  Normal urethral meatus, normal Bartholin's and Skene's glands.    Vagina: Normal vaginal mucosa, no evidence of prolapse.    Cervix: Grossly normal in appearance, slightly irregular border, no bleeding  Uterus: Non-enlarged, mobile, normal contour.  No CMT  Adnexa: ovaries non-enlarged, no adnexal masses  Rectal: exam deferred. Small external hemorrhoids  Lymphatic: no evidence of inguinal lymphadenopathy Extremities: no edema, erythema, or tenderness Neurologic: Grossly intact Psychiatric: mood appropriate, affect full    Assessment: 38 y.o. G2P1103 routine annual exam Irregular bleeding pattern  Plan: Problem List Items Addressed This Visit   None Visit Diagnoses     Encounter for annual routine gynecological examination    -  Primary   Relevant Orders   Cytology - PAP   Abnormal uterine bleeding (AUB)       Cervical cancer screening       Relevant Orders   Cytology - PAP   Women's annual routine gynecological examination           2) STI screening  wasoffered and declined  2)  ASCCP guidelines and rational discussed.  Patient opts for every 3 years screening interval  3) Contraception - the patient is currently using  tubal ligation.  She is happy with her current form of contraception and plans to continue  4) Routine healthcare maintenance including cholesterol, diabetes screening discussed managed by PCP  5) No  follow-ups on file. We discussed options for treatment to address her irregular bleeding pattern. As she is a smoker, I offered her Micronor to start. Weight loss was also included in the discussion as a possible way to regulate her AUB.  She is very interested in ablation, and I did share with her that premenopausally, this might have to be repeated as a procedure. She would like to talk with Dr. Valentino Saxon about ablation, and will make an appointment.   A total of 35 minutes was spent in performing her annual physical, and then addressing a secondary problem of AUB, thus the visit was Coded for an annual and the DUB.  Mirna Mires, CNM  09/03/2022 5:46 PM    09/03/2022, 3:12 PM

## 2022-09-04 NOTE — Progress Notes (Unsigned)
    GYNECOLOGY PROGRESS NOTE  Subjective:    Patient ID: Dawn Roman, female    DOB: 05/29/1985, 37 y.o.   MRN: [id number]  HPI  Patient is a 37 y.o. G30P1103 female who presents for consultation for endometrial ablation.  {Common ambulatory SmartLinks:19316}  Review of Systems {ros; complete:30496}   Objective:   Last menstrual period 07/31/2022. There is no height or weight on file to calculate BMI. General appearance: {general exam:16600} Abdomen: {abdominal exam:16834} Pelvic: {pelvic exam:16852::"cervix normal in appearance","external genitalia normal","no adnexal masses or tenderness","no cervical motion tenderness","rectovaginal septum normal","uterus normal size, shape, and consistency","vagina normal without discharge"} Extremities: {extremity exam:5109} Neurologic: {neuro exam:17854}   Assessment:   1. Abnormal uterine bleeding (AUB)   2. Irregular menses      Plan:   There are no diagnoses linked to this encounter.      Hildred Laser, MD [facility]

## 2022-09-05 ENCOUNTER — Encounter: Payer: Self-pay | Admitting: Obstetrics and Gynecology

## 2022-09-05 ENCOUNTER — Ambulatory Visit (INDEPENDENT_AMBULATORY_CARE_PROVIDER_SITE_OTHER): Payer: Self-pay | Admitting: Obstetrics and Gynecology

## 2022-09-05 VITALS — BP 119/68 | HR 92 | Resp 16 | Ht 59.0 in | Wt 190.5 lb

## 2022-09-05 DIAGNOSIS — N926 Irregular menstruation, unspecified: Secondary | ICD-10-CM

## 2022-09-05 DIAGNOSIS — N939 Abnormal uterine and vaginal bleeding, unspecified: Secondary | ICD-10-CM

## 2022-09-07 LAB — CYTOLOGY - PAP
Adequacy: ABSENT
Comment: NEGATIVE
Diagnosis: NEGATIVE
High risk HPV: NEGATIVE

## 2022-09-10 ENCOUNTER — Encounter: Payer: Self-pay | Admitting: Obstetrics

## 2022-09-17 ENCOUNTER — Ambulatory Visit (INDEPENDENT_AMBULATORY_CARE_PROVIDER_SITE_OTHER): Payer: Self-pay

## 2022-09-17 DIAGNOSIS — N939 Abnormal uterine and vaginal bleeding, unspecified: Secondary | ICD-10-CM

## 2022-10-07 ENCOUNTER — Encounter: Payer: Self-pay | Admitting: Obstetrics and Gynecology

## 2022-10-08 MED ORDER — NORETHINDRONE ACETATE 5 MG PO TABS: 5.0000 mg | ORAL_TABLET | Freq: Every day | ORAL | 3 refills | Status: DC

## 2023-05-13 ENCOUNTER — Other Ambulatory Visit: Payer: Self-pay | Admitting: Obstetrics and Gynecology

## 2023-09-25 NOTE — Progress Notes (Unsigned)
   PCP:  Glennon Sand, NP   No chief complaint on file.    HPI:      Ms. Dawn Roman is a 38 y.o. [phone] whose LMP was No LMP recorded. (Menstrual status: Irregular Periods)., presents today for her annual examination.  Her menses are {norm/abn:715}, lasting {number: 22536} days.  Dysmenorrhea {dysmen:716}. She {does:18564} have intermenstrual bleeding. Had AUB last yr, neg Gyn u/s, considered ablation. Started on POPs  Sex activity: {sex active: 315163}.  Last Pap: 09/03/22 Results were: no abnormalities /neg HPV DNA  Hx of STDs: {STD hx:14358}  There is no FH of breast cancer. There is no FH of ovarian cancer. The patient {does:18564} do self-breast exams.  Tobacco use: {tob:20664} Alcohol use: {Alcohol:11675} No drug use.  Exercise: {exercise:31265}  She {does:18564} get adequate calcium and Vitamin D in her diet.  Patient Active Problem List   Diagnosis Date Noted   Irregular menses 09/03/2022    Past Surgical History:  Procedure Laterality Date   CESAREAN SECTION      No family history on file.  Social History   Socioeconomic History   Marital status: Single    Spouse name: Not on file   Number of children: Not on file   Years of education: Not on file   Highest education level: Not on file  Occupational History   Not on file  Tobacco Use   Smoking status: Every Day    Current packs/day: 1.00    Types: Cigarettes   Smokeless tobacco: Not on file  Vaping Use   Vaping status: Former  Substance and Sexual Activity   Alcohol use: Yes    Comment: occ   Drug use: No   Sexual activity: Yes    Birth control/protection: None  Other Topics Concern   Not on file  Social History Narrative   Not on file   Social Drivers of Health   Financial Resource Strain: Not on file  Food Insecurity: Not on file  Transportation Needs: Not on file  Physical Activity: Not on file  Stress: Not on file  Social Connections: Not on file  Intimate Partner Violence: Not on  file     Current Outpatient Medications:    Ferrous Sulfate (IRON PO), Take 2 tablets by mouth daily. Mega[employer] Blood Builder, Disp: , Rfl:    Multiple Vitamin (MULTIVITAMIN PO), Take by mouth., Disp: , Rfl:    norethindrone  (AYGESTIN ) 5 MG tablet, TAKE 1 TABLET (5 MG TOTAL) BY MOUTH DAILY., Disp: 90 tablet, Rfl: 3     ROS:  Review of Systems BREAST: No symptoms   Objective: There were no vitals taken for this visit.   OBGyn Exam  Results: No results found for this or any previous visit (from the past 24 hours).  Assessment/Plan: No diagnosis found.  No orders of the defined types were placed in this encounter.            GYN counsel {counseling: 16159}     F/U  No follow-ups on file.  Nakeesha Bowler B. Rajesh Wyss, PA-C 09/25/2023 5:48 PM

## 2023-09-26 ENCOUNTER — Encounter: Payer: Self-pay | Admitting: Obstetrics and Gynecology

## 2023-09-26 ENCOUNTER — Ambulatory Visit (INDEPENDENT_AMBULATORY_CARE_PROVIDER_SITE_OTHER): Payer: Self-pay | Admitting: Obstetrics and Gynecology

## 2023-09-26 VITALS — BP 137/83 | HR 84 | Ht 59.0 in | Wt 190.0 lb

## 2023-09-26 DIAGNOSIS — Z01419 Encounter for gynecological examination (general) (routine) without abnormal findings: Secondary | ICD-10-CM

## 2023-09-26 DIAGNOSIS — N939 Abnormal uterine and vaginal bleeding, unspecified: Secondary | ICD-10-CM

## 2023-09-26 DIAGNOSIS — Z3041 Encounter for surveillance of contraceptive pills: Secondary | ICD-10-CM

## 2023-09-26 DIAGNOSIS — N898 Other specified noninflammatory disorders of vagina: Secondary | ICD-10-CM

## 2023-09-26 DIAGNOSIS — Z01411 Encounter for gynecological examination (general) (routine) with abnormal findings: Secondary | ICD-10-CM

## 2023-09-26 LAB — POCT WET PREP WITH KOH
Clue Cells Wet Prep HPF POC: NEGATIVE
KOH Prep POC: NEGATIVE
Trichomonas, UA: NEGATIVE
Yeast Wet Prep HPF POC: NEGATIVE

## 2023-09-26 MED ORDER — NORETHINDRONE ACETATE 5 MG PO TABS: 10.0000 mg | ORAL_TABLET | Freq: Every day | ORAL | 3 refills | Status: AC

## 2023-09-26 MED ORDER — FLUCONAZOLE 150 MG PO TABS: 150.0000 mg | ORAL_TABLET | Freq: Once | ORAL | 0 refills | Status: AC

## 2023-09-26 NOTE — Patient Instructions (Signed)
 I value your feedback and you entrusting [place] with your care. If you get a [place] patient survey, I would appreciate you taking the time to let us know about your experience today. Thank you! ? ? ?
